# Patient Record
Sex: Female | Born: 1980 | Race: White | Hispanic: No | Marital: Married | Smoking: Never smoker
Health system: Southern US, Community
[De-identification: ages and names within clinical notes are randomized; demographics above are authoritative.]

## PROBLEM LIST (undated history)

## (undated) ENCOUNTER — Inpatient Hospital Stay (HOSPITAL_COMMUNITY): Payer: Self-pay

## (undated) DIAGNOSIS — Z789 Other specified health status: Secondary | ICD-10-CM

## (undated) HISTORY — PX: NO PAST SURGERIES: SHX2092

---

## 2014-12-13 ENCOUNTER — Ambulatory Visit (HOSPITAL_COMMUNITY): Payer: Self-pay

## 2016-02-22 LAB — OB RESULTS CONSOLE RPR: RPR: NONREACTIVE

## 2016-02-22 LAB — OB RESULTS CONSOLE GC/CHLAMYDIA
CHLAMYDIA, DNA PROBE: NEGATIVE
GC PROBE AMP, GENITAL: NEGATIVE

## 2016-02-22 LAB — OB RESULTS CONSOLE ABO/RH: RH Type: POSITIVE

## 2016-02-22 LAB — OB RESULTS CONSOLE HEPATITIS B SURFACE ANTIGEN: Hepatitis B Surface Ag: NEGATIVE

## 2016-02-22 LAB — OB RESULTS CONSOLE HIV ANTIBODY (ROUTINE TESTING): HIV: NONREACTIVE

## 2016-02-22 LAB — OB RESULTS CONSOLE RUBELLA ANTIBODY, IGM: Rubella: IMMUNE

## 2016-02-22 LAB — OB RESULTS CONSOLE ANTIBODY SCREEN: ANTIBODY SCREEN: NEGATIVE

## 2016-08-02 ENCOUNTER — Encounter (HOSPITAL_COMMUNITY): Payer: Self-pay | Admitting: *Deleted

## 2016-08-02 ENCOUNTER — Inpatient Hospital Stay (HOSPITAL_COMMUNITY)
Admission: AD | Admit: 2016-08-02 | Discharge: 2016-08-02 | Disposition: A | Discharge status: Home / Self Care | Payer: Managed Care, Other (non HMO) | Source: Ambulatory Visit | Attending: Obstetrics & Gynecology | Admitting: Obstetrics & Gynecology

## 2016-08-02 DIAGNOSIS — O133 Gestational [pregnancy-induced] hypertension without significant proteinuria, third trimester: Secondary | ICD-10-CM | POA: Diagnosis not present

## 2016-08-02 DIAGNOSIS — Z3A34 34 weeks gestation of pregnancy: Secondary | ICD-10-CM | POA: Insufficient documentation

## 2016-08-02 DIAGNOSIS — O163 Unspecified maternal hypertension, third trimester: Secondary | ICD-10-CM | POA: Diagnosis present

## 2016-08-02 HISTORY — DX: Other specified health status: Z78.9

## 2016-08-02 LAB — URINALYSIS, ROUTINE W REFLEX MICROSCOPIC
BILIRUBIN URINE: NEGATIVE
Glucose, UA: NEGATIVE mg/dL
Hgb urine dipstick: NEGATIVE
Ketones, ur: NEGATIVE mg/dL
NITRITE: NEGATIVE
PH: 7 (ref 5.0–8.0)
Protein, ur: NEGATIVE mg/dL
SPECIFIC GRAVITY, URINE: 1.01 (ref 1.005–1.030)

## 2016-08-02 LAB — COMPREHENSIVE METABOLIC PANEL
ALK PHOS: 92 U/L (ref 38–126)
ALT: 12 U/L — AB (ref 14–54)
AST: 16 U/L (ref 15–41)
Albumin: 2.9 g/dL — ABNORMAL LOW (ref 3.5–5.0)
Anion gap: 10 (ref 5–15)
BUN: 5 mg/dL — ABNORMAL LOW (ref 6–20)
CALCIUM: 8.5 mg/dL — AB (ref 8.9–10.3)
CHLORIDE: 103 mmol/L (ref 101–111)
CO2: 18 mmol/L — ABNORMAL LOW (ref 22–32)
CREATININE: 0.56 mg/dL (ref 0.44–1.00)
Glucose, Bld: 77 mg/dL (ref 65–99)
Potassium: 3.5 mmol/L (ref 3.5–5.1)
Sodium: 131 mmol/L — ABNORMAL LOW (ref 135–145)
TOTAL PROTEIN: 6.9 g/dL (ref 6.5–8.1)
Total Bilirubin: 0.7 mg/dL (ref 0.3–1.2)

## 2016-08-02 LAB — URINE MICROSCOPIC-ADD ON
Bacteria, UA: NONE SEEN
RBC / HPF: NONE SEEN RBC/hpf (ref 0–5)

## 2016-08-02 LAB — CBC
HCT: 33.5 % — ABNORMAL LOW (ref 36.0–46.0)
Hemoglobin: 11.1 g/dL — ABNORMAL LOW (ref 12.0–15.0)
MCH: 27.5 pg (ref 26.0–34.0)
MCHC: 33.1 g/dL (ref 30.0–36.0)
MCV: 82.9 fL (ref 78.0–100.0)
PLATELETS: 326 10*3/uL (ref 150–400)
RBC: 4.04 MIL/uL (ref 3.87–5.11)
RDW: 14.2 % (ref 11.5–15.5)
WBC: 12.6 10*3/uL — ABNORMAL HIGH (ref 4.0–10.5)

## 2016-08-02 LAB — PROTEIN / CREATININE RATIO, URINE: CREATININE, URINE: 47 mg/dL

## 2016-08-02 NOTE — Discharge Instructions (Signed)
Hypertension During Pregnancy °Hypertension is also called high blood pressure. Blood pressure moves blood in your body. Sometimes, the force that moves the blood becomes too strong. When you are pregnant, this condition should be watched carefully. It can cause problems for you and your baby. °HOME CARE  °· Make and keep all of your doctor visits. °· Take medicine as told by your doctor. Tell your doctor about all medicines you take. °· Eat very little salt. °· Exercise regularly. °· Do not drink alcohol. °· Do not smoke. °· Do not have drinks with caffeine. °· Lie on your left side when resting. °· Your health care provider may ask you to take one low-dose aspirin (81mg) each day. °GET HELP RIGHT AWAY IF: °· You have bad belly (abdominal) pain. °· You have sudden puffiness (swelling) in the hands, ankles, or face. °· You gain 4 pounds (1.8 kilograms) or more in 1 week. °· You throw up (vomit) repeatedly. °· You have bleeding from the vagina. °· You do not feel the baby moving as much. °· You have a headache. °· You have blurred or double vision. °· You have muscle twitching or spasms. °· You have shortness of breath. °· You have blue fingernails and lips. °· You have blood in your pee (urine). °MAKE SURE YOU: °· Understand these instructions. °· Will watch your condition. °· Will get help right away if you are not doing well or get worse. °  °This information is not intended to replace advice given to you by your health care provider. Make sure you discuss any questions you have with your health care provider. °  °Document Released: 01/18/2011 Document Revised: 01/06/2015 Document Reviewed: 07/15/2013 °Elsevier Interactive Patient Education ©2016 Else[employer] Inc. ° °

## 2016-08-02 NOTE — MAU Note (Signed)
Pt states she has been experiencing eye twitching for the last 2-3 days.  Sometimes both or just one.  Pt took her own BP at home and it was 154/98 but states her BP is usually in the 120's.  Denies HA/denies visual changes expect for the "twitiching", or epigastric pain.  Good fetal movement.  No vaginal bleeding or ROM.

## 2016-08-02 NOTE — MAU Provider Note (Signed)
Chief Complaint:  Hypertension   First Provider Initiated Contact with Patient 08/02/16 1512    HPI:     Joyce Greene is a 35 y.o. G1P0 at 15w6dwho presents to maternity admissions reporting elevated BP at home today.  Has never had hypertension before.  Also complains of some twitching of her left eye.  Some swelling in feet at the end of the day. She reports good fetal movement, denies LOF, vaginal bleeding, vaginal itching/burning, urinary symptoms, h/a, dizziness, n/v, diarrhea, constipation or fever/chills.  She denies headache, visual changes or RUQ abdominal pain.  Hypertension  This is a new problem. The current episode started today. Associated symptoms include peripheral edema. Pertinent negatives include no anxiety, blurred vision, chest pain, headaches or shortness of breath. There are no associated agents to hypertension. Risk factors: Pregnancy. Past treatments include nothing. There are no compliance problems.    RN Note: Pt states she has been experiencing eye twitching for the last 2-3 days.  Sometimes both or just one.  Pt took her own BP at home and it was 154/98 but states her BP is usually in the 120's.  Denies HA/denies visual changes expect for the "twitiching", or epigastric pain.  Good fetal movement.  No vaginal bleeding or ROM.  Past Medical History: No past medical history on file.  Past obstetric history: OB History  Gravida Para Term Preterm AB Living  1            SAB TAB Ectopic Multiple Live Births               # Outcome Date GA Lbr Len/2nd Weight Sex Delivery Anes PTL Lv  1 Current               Past Surgical History: No past surgical history on file.  Family History: No family history on file.  Social History: Social History  Substance Use Topics  . Smoking status: Not on file  . Smokeless tobacco: Not on file  . Alcohol use Not on file    Allergies: Allergies not on file  Meds:  No prescriptions prior to admission.    I have  reviewed patient's Past Medical Hx, Surgical Hx, Family Hx, Social Hx, medications and allergies.   ROS:  Review of Systems  Eyes: Negative for blurred vision and visual disturbance.  Respiratory: Negative for shortness of breath.   Cardiovascular: Negative for chest pain.  Gastrointestinal: Negative for constipation, diarrhea, nausea and vomiting.  Genitourinary: Negative for pelvic pain and vaginal bleeding.  Musculoskeletal: Negative for back pain.  Neurological: Negative for dizziness, seizures, syncope, weakness, numbness and headaches.   Other systems negative  Physical Exam  Patient Vitals for the past 24 hrs:  BP Temp Temp src Pulse Resp SpO2  08/02/16 1508 146/92 97.9 F (36.6 C) Oral 99 20 98 %   Vitals:   08/02/16 1544 08/02/16 1559 08/02/16 1622 08/02/16 1810  BP: 131/83 123/84 112/81 136/75  Pulse: 91 100 94 83  Resp:    18  Temp:    97.8 F (36.6 C)  TempSrc:    Oral  SpO2:        Constitutional: Well-developed, well-nourished female in no acute distress.  Cardiovascular: normal rate and rhythm Respiratory: normal effort, clear to auscultation bilaterally GI: Abd soft, non-tender, gravid appropriate for gestational age.   No rebound or guarding. MS: Extremities nontender, Trace to 1+ pedal edema, normal ROM Neurologic: Alert and oriented x 4.   DTRs 2+ with no clonus  GU: Neg CVAT.   FHT:  Baseline 130 , moderate variability, accelerations present, no decelerations Contractions: q 2-3 mins Irregular, mild   Labs: Results for orders placed or performed during the hospital encounter of 08/02/16 (from the past 24 hour(s))  Urinalysis, Routine w reflex microscopic (not at [facility])     Status: Abnormal   Collection Time: 08/02/16  3:00 PM  Result Value Ref Range   Color, Urine YELLOW YELLOW   APPearance CLEAR CLEAR   Specific Gravity, Urine 1.010 1.005 - 1.030   pH 7.0 5.0 - 8.0   Glucose, UA NEGATIVE NEGATIVE mg/dL   Hgb urine dipstick NEGATIVE NEGATIVE    Bilirubin Urine NEGATIVE NEGATIVE   Ketones, ur NEGATIVE NEGATIVE mg/dL   Protein, ur NEGATIVE NEGATIVE mg/dL   Nitrite NEGATIVE NEGATIVE   Leukocytes, UA SMALL (A) NEGATIVE  Protein / creatinine ratio, urine     Status: None   Collection Time: 08/02/16  3:00 PM  Result Value Ref Range   Creatinine, Urine 47.00 mg/dL   Total Protein, Urine <6 mg/dL   Protein Creatinine Ratio        0.00 - 0.15 mg/mg[Cre]  Urine microscopic-add on     Status: Abnormal   Collection Time: 08/02/16  3:00 PM  Result Value Ref Range   Squamous Epithelial / LPF 0-5 (A) NONE SEEN   WBC, UA 0-5 0 - 5 WBC/hpf   RBC / HPF NONE SEEN 0 - 5 RBC/hpf   Bacteria, UA NONE SEEN NONE SEEN  CBC     Status: Abnormal   Collection Time: 08/02/16  4:34 PM  Result Value Ref Range   WBC 12.6 (H) 4.0 - 10.5 K/uL   RBC 4.04 3.87 - 5.11 MIL/uL   Hemoglobin 11.1 (L) 12.0 - 15.0 g/dL   HCT 16.1 (L) 09.6 - 04.5 %   MCV 82.9 78.0 - 100.0 fL   MCH 27.5 26.0 - 34.0 pg   MCHC 33.1 30.0 - 36.0 g/dL   RDW 40.9 81.1 - 91.4 %   Platelets 326 150 - 400 K/uL  Comprehensive metabolic panel     Status: Abnormal   Collection Time: 08/02/16  4:34 PM  Result Value Ref Range   Sodium 131 (L) 135 - 145 mmol/L   Potassium 3.5 3.5 - 5.1 mmol/L   Chloride 103 101 - 111 mmol/L   CO2 18 (L) 22 - 32 mmol/L   Glucose, Bld 77 65 - 99 mg/dL   BUN 5 (L) 6 - 20 mg/dL   Creatinine, Ser 7.82 0.44 - 1.00 mg/dL   Calcium 8.5 (L) 8.9 - 10.3 mg/dL   Total Protein 6.9 6.5 - 8.1 g/dL   Albumin 2.9 (L) 3.5 - 5.0 g/dL   AST 16 15 - 41 U/L   ALT 12 (L) 14 - 54 U/L   Alkaline Phosphatase 92 38 - 126 U/L   Total Bilirubin 0.7 0.3 - 1.2 mg/dL   GFR calc non Af Amer >60 >60 mL/min   GFR calc Af Amer >60 >60 mL/min   Anion gap 10 5 - 15    Imaging:  No results found.  MAU Course/MDM: I have ordered labs and reviewed results.  NST reviewed Consult Dr Langston Masker with presentation, exam findings and test results.  Treatments in MAU included none.     Assessment: [facility] at [redacted]w[redacted]d Gestational Hypertension with no signs of preeclampsia Reassuring fetal heart rate tracing  Plan: Discharge home Preeclampsia signs and symptoms reviewed in detail Preterm Labor precautions and  fetal kick counts Follow up in Office for prenatal visits and recheck early next week    Medication List    You have not been prescribed any medications.    Pt stable at time of discharge.  Encouraged to return here or to other Urgent Care/ED if she develops worsening of symptoms, increase in pain, fever, or other concerning symptoms.      Wynelle Bourgeois CNM, MSN Certified Nurse-Midwife 08/02/2016 3:12 PM

## 2016-08-30 ENCOUNTER — Telehealth (HOSPITAL_COMMUNITY): Payer: Self-pay | Admitting: *Deleted

## 2016-08-30 ENCOUNTER — Encounter (HOSPITAL_COMMUNITY): Payer: Self-pay | Admitting: *Deleted

## 2016-08-30 LAB — OB RESULTS CONSOLE GBS: STREP GROUP B AG: NEGATIVE

## 2016-08-30 NOTE — Telephone Encounter (Signed)
Preadmission screen  

## 2016-09-05 ENCOUNTER — Encounter (HOSPITAL_COMMUNITY)
Admission: RE | Admit: 2016-09-05 | Discharge: 2016-09-05 | Disposition: A | Discharge status: Home / Self Care | Payer: Managed Care, Other (non HMO) | Source: Ambulatory Visit | Attending: Obstetrics and Gynecology | Admitting: Obstetrics and Gynecology

## 2016-09-05 LAB — CBC
HEMATOCRIT: 32.7 % — AB (ref 36.0–46.0)
HEMOGLOBIN: 10.8 g/dL — AB (ref 12.0–15.0)
MCH: 27.2 pg (ref 26.0–34.0)
MCHC: 33 g/dL (ref 30.0–36.0)
MCV: 82.4 fL (ref 78.0–100.0)
Platelets: 350 10*3/uL (ref 150–400)
RBC: 3.97 MIL/uL (ref 3.87–5.11)
RDW: 14.8 % (ref 11.5–15.5)
WBC: 15.2 10*3/uL — ABNORMAL HIGH (ref 4.0–10.5)

## 2016-09-05 LAB — ABO/RH: ABO/RH(D): A POS

## 2016-09-05 LAB — TYPE AND SCREEN
ABO/RH(D): A POS
Antibody Screen: NEGATIVE

## 2016-09-05 NOTE — H&P (Signed)
[place] Joyce Greene is a 35 y.o. female presenting for primary cesarean section. U/S in office 08/29/16 had EFW 4273 gm (9# 7oz). Glucola normal. Also bilateral fetal pyelectasis noted through pregnancy. OB History    Gravida Para Term Preterm AB Living   3       2     SAB TAB Ectopic Multiple Live Births   2       0     Past Medical History:  Diagnosis Date  . Medical history non-contributory    Past Surgical History:  Procedure Laterality Date  . NO PAST SURGERIES     Family History: family history includes Cancer in her maternal grandmother and paternal grandmother; Hypertension in her father; Stroke in her paternal grandfather. Social History:  reports that she has never smoked. She has never used smokeless tobacco. She reports that she does not drink alcohol or use drugs.     Maternal Diabetes: No Genetic Screening: Normal Maternal Ultrasounds/Referrals: Normal Fetal Ultrasounds or other Referrals:  None Maternal Substance Abuse:  No Significant Maternal Medications:  None Significant Maternal Lab Results:  None Other Comments:  None  Review of Systems  Eyes: Negative for blurred vision.  Gastrointestinal: Negative for abdominal pain.  Neurological: Negative for headaches.   Maternal Medical History:  Fetal activity: Perceived fetal activity is normal.        There were no vitals taken for this visit. Maternal Exam:  Abdomen: Patient reports no abdominal tenderness. Fetal presentation: vertex     Physical Exam  Cardiovascular: Normal rate and regular rhythm.   Respiratory: Effort normal and breath sounds normal.  GI: Soft. There is no tenderness.  Neurological: She has normal reflexes.    Prenatal labs: ABO, Rh: A/Positive/-- (02/23 0000) Antibody: Negative (02/23 0000) Rubella: Immune (02/23 0000) RPR: Nonreactive (02/23 0000)  HBsAg: Negative (02/23 0000)  HIV: Non-reactive (02/23 0000)  GBS: Negative (09/01 0000)   Assessment/Plan: 35 yo G3P0 with  fetal macrosomia Options reviewed with patient and she elects primary cesarean section Risks reviewed including infection, organ damage, bleeding/transfusion-HIV/Hep, DVT/PE, pneumonia.  All questions answered Patient states she understands and agrees   Vesta Wheeland II,Dakai Braithwaite E 09/05/2016, 9:09 AM

## 2016-09-06 ENCOUNTER — Inpatient Hospital Stay (HOSPITAL_COMMUNITY): Payer: Managed Care, Other (non HMO) | Admitting: Certified Registered Nurse Anesthetist

## 2016-09-06 ENCOUNTER — Encounter (HOSPITAL_COMMUNITY): Payer: Self-pay

## 2016-09-06 ENCOUNTER — Encounter (HOSPITAL_COMMUNITY)
Admission: RE | Disposition: A | Discharge status: Home / Self Care | Payer: Self-pay | Source: Ambulatory Visit | Attending: Obstetrics and Gynecology

## 2016-09-06 ENCOUNTER — Inpatient Hospital Stay (HOSPITAL_COMMUNITY)
Admission: RE | Admit: 2016-09-06 | Discharge: 2016-09-09 | DRG: 766 | Disposition: A | Discharge status: Home / Self Care | Payer: Managed Care, Other (non HMO) | Source: Ambulatory Visit | Attending: Obstetrics and Gynecology | Admitting: Obstetrics and Gynecology

## 2016-09-06 ENCOUNTER — Inpatient Hospital Stay (HOSPITAL_COMMUNITY): Admission: RE | Admit: 2016-09-06 | Payer: Managed Care, Other (non HMO) | Source: Ambulatory Visit

## 2016-09-06 DIAGNOSIS — O99214 Obesity complicating childbirth: Secondary | ICD-10-CM | POA: Diagnosis present

## 2016-09-06 DIAGNOSIS — O3660X0 Maternal care for excessive fetal growth, unspecified trimester, not applicable or unspecified: Secondary | ICD-10-CM | POA: Diagnosis present

## 2016-09-06 DIAGNOSIS — Z8249 Family history of ischemic heart disease and other diseases of the circulatory system: Secondary | ICD-10-CM

## 2016-09-06 DIAGNOSIS — O358XX0 Maternal care for other (suspected) fetal abnormality and damage, not applicable or unspecified: Secondary | ICD-10-CM | POA: Diagnosis present

## 2016-09-06 DIAGNOSIS — K219 Gastro-esophageal reflux disease without esophagitis: Secondary | ICD-10-CM | POA: Diagnosis present

## 2016-09-06 DIAGNOSIS — O9962 Diseases of the digestive system complicating childbirth: Secondary | ICD-10-CM | POA: Diagnosis present

## 2016-09-06 DIAGNOSIS — Z3A39 39 weeks gestation of pregnancy: Secondary | ICD-10-CM | POA: Diagnosis not present

## 2016-09-06 DIAGNOSIS — O3663X0 Maternal care for excessive fetal growth, third trimester, not applicable or unspecified: Secondary | ICD-10-CM | POA: Diagnosis present

## 2016-09-06 DIAGNOSIS — E669 Obesity, unspecified: Secondary | ICD-10-CM | POA: Diagnosis present

## 2016-09-06 DIAGNOSIS — Z823 Family history of stroke: Secondary | ICD-10-CM | POA: Diagnosis not present

## 2016-09-06 DIAGNOSIS — IMO0002 Reserved for concepts with insufficient information to code with codable children: Secondary | ICD-10-CM | POA: Diagnosis present

## 2016-09-06 DIAGNOSIS — Z6838 Body mass index (BMI) 38.0-38.9, adult: Secondary | ICD-10-CM

## 2016-09-06 LAB — RPR: RPR: NONREACTIVE

## 2016-09-06 SURGERY — Surgical Case
Anesthesia: Spinal

## 2016-09-06 MED ORDER — BUPIVACAINE IN DEXTROSE 0.75-8.25 % IT SOLN
INTRATHECAL | Status: DC | PRN
  Administered 2016-09-06: 1.7 mL via INTRATHECAL

## 2016-09-06 MED ORDER — FENTANYL CITRATE (PF) 100 MCG/2ML IJ SOLN: 25.0000 ug | INTRAMUSCULAR | Status: DC | PRN

## 2016-09-06 MED ORDER — SIMETHICONE 80 MG PO CHEW
80.0000 mg | CHEWABLE_TABLET | Freq: Three times a day (TID) | ORAL | Status: DC
  Administered 2016-09-07 – 2016-09-09 (×7): 80 mg via ORAL
  Filled 2016-09-06 (×7): qty 1

## 2016-09-06 MED ORDER — MORPHINE SULFATE (PF) 0.5 MG/ML IJ SOLN
INTRAMUSCULAR | Status: DC | PRN
  Administered 2016-09-06: .2 mg via INTRATHECAL

## 2016-09-06 MED ORDER — OXYTOCIN 40 UNITS IN LACTATED RINGERS INFUSION - SIMPLE MED: 2.5000 [IU]/h | INTRAVENOUS | Status: AC

## 2016-09-06 MED ORDER — LACTATED RINGERS IV SOLN
INTRAVENOUS | Status: DC
  Administered 2016-09-06 (×3): via INTRAVENOUS

## 2016-09-06 MED ORDER — DIPHENHYDRAMINE HCL 25 MG PO CAPS: 25.0000 mg | ORAL_CAPSULE | ORAL | Status: DC | PRN

## 2016-09-06 MED ORDER — PHENYLEPHRINE 8 MG IN D5W 100 ML (0.08MG/ML) PREMIX OPTIME
INJECTION | INTRAVENOUS | Status: AC
  Filled 2016-09-06: qty 100

## 2016-09-06 MED ORDER — KETOROLAC TROMETHAMINE 30 MG/ML IJ SOLN
INTRAMUSCULAR | Status: AC
  Filled 2016-09-06: qty 1

## 2016-09-06 MED ORDER — FENTANYL CITRATE (PF) 100 MCG/2ML IJ SOLN
INTRAMUSCULAR | Status: DC | PRN
  Administered 2016-09-06: 20 ug via INTRATHECAL

## 2016-09-06 MED ORDER — MEPERIDINE HCL 25 MG/ML IJ SOLN
6.2500 mg | INTRAMUSCULAR | Status: DC | PRN
  Administered 2016-09-06: 6.25 mg via INTRAVENOUS

## 2016-09-06 MED ORDER — TETANUS-DIPHTH-ACELL PERTUSSIS 5-2.5-18.5 LF-MCG/0.5 IM SUSP: 0.5000 mL | Freq: Once | INTRAMUSCULAR | Status: DC

## 2016-09-06 MED ORDER — NALOXONE HCL 2 MG/2ML IJ SOSY
1.0000 ug/kg/h | PREFILLED_SYRINGE | INTRAVENOUS | Status: DC | PRN
  Filled 2016-09-06: qty 2

## 2016-09-06 MED ORDER — ZOLPIDEM TARTRATE 5 MG PO TABS: 5.0000 mg | ORAL_TABLET | Freq: Every evening | ORAL | Status: DC | PRN

## 2016-09-06 MED ORDER — COCONUT OIL OIL
1.0000 "application " | TOPICAL_OIL | Status: DC | PRN
  Filled 2016-09-06: qty 120

## 2016-09-06 MED ORDER — ONDANSETRON HCL 4 MG/2ML IJ SOLN
INTRAMUSCULAR | Status: AC
  Filled 2016-09-06: qty 2

## 2016-09-06 MED ORDER — IBUPROFEN 600 MG PO TABS
600.0000 mg | ORAL_TABLET | Freq: Four times a day (QID) | ORAL | Status: DC
  Administered 2016-09-06 – 2016-09-09 (×11): 600 mg via ORAL
  Filled 2016-09-06 (×12): qty 1

## 2016-09-06 MED ORDER — SODIUM CHLORIDE 0.9% FLUSH: 3.0000 mL | INTRAVENOUS | Status: DC | PRN

## 2016-09-06 MED ORDER — ACETAMINOPHEN 325 MG PO TABS: 650.0000 mg | ORAL_TABLET | ORAL | Status: DC | PRN

## 2016-09-06 MED ORDER — SENNOSIDES-DOCUSATE SODIUM 8.6-50 MG PO TABS
2.0000 | ORAL_TABLET | ORAL | Status: DC
  Administered 2016-09-06 – 2016-09-09 (×3): 2 via ORAL
  Filled 2016-09-06 (×3): qty 2

## 2016-09-06 MED ORDER — PHENYLEPHRINE 40 MCG/ML (10ML) SYRINGE FOR IV PUSH (FOR BLOOD PRESSURE SUPPORT)
PREFILLED_SYRINGE | INTRAVENOUS | Status: AC
  Filled 2016-09-06: qty 10

## 2016-09-06 MED ORDER — NALBUPHINE HCL 10 MG/ML IJ SOLN
5.0000 mg | INTRAMUSCULAR | Status: DC | PRN
  Administered 2016-09-06 (×2): 5 mg via INTRAVENOUS
  Filled 2016-09-06: qty 1

## 2016-09-06 MED ORDER — SODIUM CHLORIDE 0.9 % IR SOLN
Status: DC | PRN
  Administered 2016-09-06: 1

## 2016-09-06 MED ORDER — CEFAZOLIN SODIUM-DEXTROSE 2-4 GM/100ML-% IV SOLN
2.0000 g | INTRAVENOUS | Status: AC
  Administered 2016-09-06: 2 g via INTRAVENOUS

## 2016-09-06 MED ORDER — SCOPOLAMINE 1 MG/3DAYS TD PT72
1.0000 | MEDICATED_PATCH | Freq: Once | TRANSDERMAL | Status: DC
  Administered 2016-09-06: 1.5 mg via TRANSDERMAL

## 2016-09-06 MED ORDER — PHENYLEPHRINE HCL 10 MG/ML IJ SOLN
INTRAMUSCULAR | Status: DC | PRN
  Administered 2016-09-06 (×2): 40 ug via INTRAVENOUS

## 2016-09-06 MED ORDER — IBUPROFEN 600 MG PO TABS: 600.0000 mg | ORAL_TABLET | Freq: Four times a day (QID) | ORAL | Status: DC | PRN

## 2016-09-06 MED ORDER — OXYTOCIN 40 UNITS IN LACTATED RINGERS INFUSION - SIMPLE MED
INTRAVENOUS | Status: DC | PRN
  Administered 2016-09-06: 40 [IU] via INTRAVENOUS

## 2016-09-06 MED ORDER — ONDANSETRON HCL 4 MG/2ML IJ SOLN: 4.0000 mg | Freq: Three times a day (TID) | INTRAMUSCULAR | Status: DC | PRN

## 2016-09-06 MED ORDER — LACTATED RINGERS IV SOLN: INTRAVENOUS | Status: DC

## 2016-09-06 MED ORDER — KETOROLAC TROMETHAMINE 30 MG/ML IJ SOLN: 30.0000 mg | Freq: Four times a day (QID) | INTRAMUSCULAR | Status: AC | PRN

## 2016-09-06 MED ORDER — WITCH HAZEL-GLYCERIN EX PADS: 1.0000 "application " | MEDICATED_PAD | CUTANEOUS | Status: DC | PRN

## 2016-09-06 MED ORDER — SIMETHICONE 80 MG PO CHEW: 80.0000 mg | CHEWABLE_TABLET | ORAL | Status: DC | PRN

## 2016-09-06 MED ORDER — NALBUPHINE HCL 10 MG/ML IJ SOLN: 5.0000 mg | Freq: Once | INTRAMUSCULAR | Status: DC | PRN

## 2016-09-06 MED ORDER — MORPHINE SULFATE-NACL 0.5-0.9 MG/ML-% IV SOSY
PREFILLED_SYRINGE | INTRAVENOUS | Status: AC
  Filled 2016-09-06: qty 1

## 2016-09-06 MED ORDER — PRENATAL MULTIVITAMIN CH
1.0000 | ORAL_TABLET | Freq: Every day | ORAL | Status: DC
  Administered 2016-09-07 – 2016-09-09 (×3): 1 via ORAL
  Filled 2016-09-06 (×3): qty 1

## 2016-09-06 MED ORDER — ACETAMINOPHEN 500 MG PO TABS
1000.0000 mg | ORAL_TABLET | Freq: Four times a day (QID) | ORAL | Status: AC
  Administered 2016-09-06 – 2016-09-07 (×4): 1000 mg via ORAL
  Filled 2016-09-06 (×4): qty 2

## 2016-09-06 MED ORDER — OXYCODONE HCL 5 MG PO TABS: 5.0000 mg | ORAL_TABLET | ORAL | Status: DC | PRN

## 2016-09-06 MED ORDER — DIPHENHYDRAMINE HCL 25 MG PO CAPS: 25.0000 mg | ORAL_CAPSULE | Freq: Four times a day (QID) | ORAL | Status: DC | PRN

## 2016-09-06 MED ORDER — OXYTOCIN 10 UNIT/ML IJ SOLN
INTRAMUSCULAR | Status: AC
  Filled 2016-09-06: qty 4

## 2016-09-06 MED ORDER — DIPHENHYDRAMINE HCL 50 MG/ML IJ SOLN
12.5000 mg | INTRAMUSCULAR | Status: DC | PRN
Start: 2016-09-06 — End: 2016-09-09

## 2016-09-06 MED ORDER — SIMETHICONE 80 MG PO CHEW
80.0000 mg | CHEWABLE_TABLET | ORAL | Status: DC
  Administered 2016-09-06 – 2016-09-09 (×3): 80 mg via ORAL
  Filled 2016-09-06 (×3): qty 1

## 2016-09-06 MED ORDER — SCOPOLAMINE 1 MG/3DAYS TD PT72
MEDICATED_PATCH | TRANSDERMAL | Status: AC
  Administered 2016-09-06: 1.5 mg via TRANSDERMAL
  Filled 2016-09-06: qty 1

## 2016-09-06 MED ORDER — DIBUCAINE 1 % RE OINT: 1.0000 "application " | TOPICAL_OINTMENT | RECTAL | Status: DC | PRN

## 2016-09-06 MED ORDER — LACTATED RINGERS IV SOLN
INTRAVENOUS | Status: DC | PRN
  Administered 2016-09-06: 12:00:00 via INTRAVENOUS

## 2016-09-06 MED ORDER — OXYCODONE HCL 5 MG PO TABS: 10.0000 mg | ORAL_TABLET | ORAL | Status: DC | PRN

## 2016-09-06 MED ORDER — NALBUPHINE HCL 10 MG/ML IJ SOLN: 5.0000 mg | INTRAMUSCULAR | Status: DC | PRN

## 2016-09-06 MED ORDER — ONDANSETRON HCL 4 MG/2ML IJ SOLN
INTRAMUSCULAR | Status: DC | PRN
  Administered 2016-09-06: 4 mg via INTRAVENOUS

## 2016-09-06 MED ORDER — KETOROLAC TROMETHAMINE 30 MG/ML IJ SOLN
30.0000 mg | Freq: Four times a day (QID) | INTRAMUSCULAR | Status: AC | PRN
  Administered 2016-09-06: 30 mg via INTRAMUSCULAR

## 2016-09-06 MED ORDER — PHENYLEPHRINE 8 MG IN D5W 100 ML (0.08MG/ML) PREMIX OPTIME
INJECTION | INTRAVENOUS | Status: DC | PRN
  Administered 2016-09-06: 30 ug/min via INTRAVENOUS

## 2016-09-06 MED ORDER — FENTANYL CITRATE (PF) 100 MCG/2ML IJ SOLN
INTRAMUSCULAR | Status: AC
  Filled 2016-09-06: qty 2

## 2016-09-06 MED ORDER — NALOXONE HCL 0.4 MG/ML IJ SOLN: 0.4000 mg | INTRAMUSCULAR | Status: DC | PRN

## 2016-09-06 MED ORDER — MEPERIDINE HCL 25 MG/ML IJ SOLN
INTRAMUSCULAR | Status: AC
  Filled 2016-09-06: qty 1

## 2016-09-06 MED ORDER — NALBUPHINE HCL 10 MG/ML IJ SOLN
5.0000 mg | Freq: Once | INTRAMUSCULAR | Status: DC | PRN
  Filled 2016-09-06: qty 1

## 2016-09-06 MED ORDER — MENTHOL 3 MG MT LOZG: 1.0000 | LOZENGE | OROMUCOSAL | Status: DC | PRN

## 2016-09-06 SURGICAL SUPPLY — 38 items
BENZOIN TINCTURE PRP APPL 2/3 (GAUZE/BANDAGES/DRESSINGS) ×3 IMPLANT
CHLORAPREP W/TINT 26ML (MISCELLANEOUS) ×3 IMPLANT
CLAMP CORD UMBIL (MISCELLANEOUS) IMPLANT
CLOSURE STERI-STRIP 1/2X4 (GAUZE/BANDAGES/DRESSINGS) ×1
CLOSURE WOUND 1/2 X4 (GAUZE/BANDAGES/DRESSINGS)
CLOTH BEACON ORANGE TIMEOUT ST (SAFETY) ×3 IMPLANT
CLSR STERI-STRIP ANTIMIC 1/2X4 (GAUZE/BANDAGES/DRESSINGS) ×2 IMPLANT
CONTAINER PREFILL 10% NBF 15ML (MISCELLANEOUS) IMPLANT
DRSG OPSITE POSTOP 4X10 (GAUZE/BANDAGES/DRESSINGS) ×3 IMPLANT
ELECT REM PT RETURN 9FT ADLT (ELECTROSURGICAL) ×3
ELECTRODE REM PT RTRN 9FT ADLT (ELECTROSURGICAL) ×1 IMPLANT
EXTRACTOR VACUUM M CUP 4 TUBE (SUCTIONS) IMPLANT
EXTRACTOR VACUUM M CUP 4' TUBE (SUCTIONS)
GAUZE SPONGE 4X4 12PLY STRL (GAUZE/BANDAGES/DRESSINGS) ×3 IMPLANT
GLOVE BIO SURGEON STRL SZ8 (GLOVE) ×3 IMPLANT
GLOVE BIOGEL PI IND STRL 7.0 (GLOVE) ×1 IMPLANT
GLOVE BIOGEL PI INDICATOR 7.0 (GLOVE) ×2
GOWN STRL REUS W/TWL LRG LVL3 (GOWN DISPOSABLE) ×6 IMPLANT
KIT ABG SYR 3ML LUER SLIP (SYRINGE) ×3 IMPLANT
LIQUID BAND (GAUZE/BANDAGES/DRESSINGS) ×3 IMPLANT
NEEDLE HYPO 25X5/8 SAFETYGLIDE (NEEDLE) ×3 IMPLANT
NS IRRIG 1000ML POUR BTL (IV SOLUTION) ×3 IMPLANT
PACK C SECTION WH (CUSTOM PROCEDURE TRAY) ×3 IMPLANT
PAD ABD 7.5X8 STRL (GAUZE/BANDAGES/DRESSINGS) ×3 IMPLANT
PAD ABD DERMACEA PRESS 5X9 (GAUZE/BANDAGES/DRESSINGS) ×3 IMPLANT
PAD OB MATERNITY 4.3X12.25 (PERSONAL CARE ITEMS) ×3 IMPLANT
PENCIL SMOKE EVAC W/HOLSTER (ELECTROSURGICAL) ×3 IMPLANT
STRIP CLOSURE SKIN 1/2X4 (GAUZE/BANDAGES/DRESSINGS) IMPLANT
SUT MNCRL 0 VIOLET CTX 36 (SUTURE) ×4 IMPLANT
SUT MONOCRYL 0 CTX 36 (SUTURE) ×8
SUT PDS AB 0 CTX 60 (SUTURE) ×3 IMPLANT
SUT PLAIN 0 NONE (SUTURE) IMPLANT
SUT PLAIN 2 0 (SUTURE)
SUT PLAIN 2 0 XLH (SUTURE) IMPLANT
SUT PLAIN ABS 2-0 CT1 27XMFL (SUTURE) IMPLANT
SUT VIC AB 4-0 KS 27 (SUTURE) ×3 IMPLANT
TOWEL OR 17X24 6PK STRL BLUE (TOWEL DISPOSABLE) ×3 IMPLANT
TRAY FOLEY CATH SILVER 14FR (SET/KITS/TRAYS/PACK) ×3 IMPLANT

## 2016-09-06 NOTE — Anesthesia Postprocedure Evaluation (Signed)
Anesthesia Post Note  Patient: [place]hell Levan[place]ia Vanacker  Procedure(s) Performed: Procedure(s) (LRB): CESAREAN SECTION (N/A)  Patient location during evaluation: PACU Anesthesia Type: Spinal Level of consciousness: oriented and awake and alert Pain management: pain level controlled Vital Signs Assessment: post-procedure vital signs reviewed and stable Respiratory status: spontaneous breathing, respiratory function stable and nonlabored ventilation Cardiovascular status: blood pressure returned to baseline and stable Postop Assessment: no headache, no backache, spinal receding, patient able to bend at knees and no signs of nausea or vomiting Anesthetic complications: no     Last Vitals:  Vitals:   09/06/16 1325 09/06/16 1335  BP:  132/77  Pulse: 88 82  Resp: (!) 25 18  Temp:  37.2 C    Last Pain:  Vitals:   09/06/16 1335  TempSrc: Oral  PainSc:    Pain Goal: Patients Stated Pain Goal: 3 (09/06/16 1013)               Ahman Dugdale A.

## 2016-09-06 NOTE — Anesthesia Postprocedure Evaluation (Signed)
Anesthesia Post Note  Patient: Marshell LevanVirginia Defalco  Procedure(s) Performed: Procedure(s) (LRB): CESAREAN SECTION (N/A)  Patient location during evaluation: Mother Baby Anesthesia Type: Spinal Level of consciousness: awake and alert Pain management: pain level controlled Vital Signs Assessment: post-procedure vital signs reviewed and stable Respiratory status: spontaneous breathing and nonlabored ventilation Cardiovascular status: stable Postop Assessment: no headache, patient able to bend at knees, no signs of nausea or vomiting, no backache, spinal receding and adequate PO intake Anesthetic complications: no     Last Vitals:  Vitals:   09/06/16 1335 09/06/16 1430  BP: 132/77 130/74  Pulse: 82 80  Resp: 18 18  Temp: 37.2 C 36.8 C    Last Pain:  Vitals:   09/06/16 1430  TempSrc: Oral  PainSc: 3    Pain Goal: Patients Stated Pain Goal: 3 (09/06/16 1330)               Kaz Auld Hristova

## 2016-09-06 NOTE — Progress Notes (Signed)
Pt assisted up to bathroom to do peri care, peri care taught. Pt tolerated ambulation well. Pt sitting on edge of bed at present.

## 2016-09-06 NOTE — Anesthesia Preprocedure Evaluation (Addendum)
Anesthesia Evaluation  Patient identified by MRN, date of birth, ID band Patient awake    Reviewed: Allergy & Precautions, NPO status , Patient's Chart, lab work & pertinent test results  Airway Mallampati: II       Dental no notable dental hx. (+) Teeth Intact   Pulmonary neg pulmonary ROS,    Pulmonary exam normal breath sounds clear to auscultation       Cardiovascular negative cardio ROS Normal cardiovascular exam Rhythm:Regular Rate:Normal     Neuro/Psych negative neurological ROS  negative psych ROS   GI/Hepatic Neg liver ROS, GERD  Medicated and Controlled,  Endo/Other  Obesity  Renal/GU negative Renal ROS  negative genitourinary   Musculoskeletal negative musculoskeletal ROS (+)   Abdominal (+) - obese,   Peds  Hematology  (+) anemia ,   Anesthesia Other Findings   Reproductive/Obstetrics (+) Pregnancy Fetal macrosomia                            Lab Results  Component Value Date   WBC 15.2 (H) 09/05/2016   HGB 10.8 (L) 09/05/2016   HCT 32.7 (L) 09/05/2016   MCV 82.4 09/05/2016   PLT 350 09/05/2016    Anesthesia Physical Anesthesia Plan  ASA: II  Anesthesia Plan: Spinal   Post-op Pain Management:    Induction:   Airway Management Planned: Natural Airway  Additional Equipment:   Intra-op Plan:   Post-operative Plan:   Informed Consent: I have reviewed the patients History and Physical, chart, labs and discussed the procedure including the risks, benefits and alternatives for the proposed anesthesia with the patient or authorized representative who has indicated his/her understanding and acceptance.     Plan Discussed with: Anesthesiologist, CRNA and Surgeon  Anesthesia Plan Comments:         Anesthesia Quick Evaluation

## 2016-09-06 NOTE — Progress Notes (Signed)
No changes to H&P per patient history Reviewed procedure-cesarean section All questions answered Patient states she understands and agrees 

## 2016-09-06 NOTE — Brief Op Note (Signed)
09/06/2016  11:55 AM  PATIENT:  [place]ce [place] Greene  35 y.o. female  PRE-OPERATIVE DIAGNOSIS:  large EFW  POST-OPERATIVE DIAGNOSIS:  CESAREAN SECTION large EFW  PROCEDURE:  Procedure(s) with comments: CESAREAN SECTION (N/A) - Primary edc 09/07/16 nkda   SURGEON:  Surgeon(s) and Role:    * Harold HedgeJames Janet Decesare, MD - Primary  PHYSICIAN ASSISTANT:   ASSISTANTS: none   ANESTHESIA:   spinal  EBL:  Total I/O In: -  Out: 750 [Urine:50; Blood:700]  BLOOD ADMINISTERED:none  DRAINS: Urinary Catheter (Foley)   LOCAL MEDICATIONS USED:  NONE  SPECIMEN:  No Specimen  DISPOSITION OF SPECIMEN:  N/A  COUNTS:  YES  TOURNIQUET:  * No tourniquets in log *  DICTATION: .Other Dictation: Dictation Number [id number]  PLAN OF CARE: Admit to inpatient   PATIENT DISPOSITION:  PACU - hemodynamically stable.   Delay start of Pharmacological VTE agent (>24hrs) due to surgical blood loss or risk of bleeding: not applicable

## 2016-09-06 NOTE — Transfer of Care (Signed)
Immediate Anesthesia Transfer of Care Note  Patient: Joyce LevanVirginia Tristan  Procedure(s) Performed: Procedure(s) with comments: CESAREAN SECTION (N/A) - Primary edc 09/07/16 nkda   Patient Location: PACU  Anesthesia Type:Spinal  Level of Consciousness: awake and alert   Airway & Oxygen Therapy: Patient Spontanous Breathing  Post-op Assessment: Report given to RN and Post -op Vital signs reviewed and stable  Post vital signs: Reviewed  Last Vitals:  Vitals:   09/06/16 1013  BP: 126/89  Pulse: (!) 109  Resp: 20  Temp: 36.8 C    Last Pain:  Vitals:   09/06/16 1013  TempSrc: Oral      Patients Stated Pain Goal: 3 (09/06/16 1013)  Complications: No apparent anesthesia complications

## 2016-09-06 NOTE — Progress Notes (Signed)
IV noted to feel wet at site.  Inspected for leaks at hub, nothing found, but felt wet. IV appeared to be falling out. Dc'd this site, catheter intact. Restarted IV in Left hand.

## 2016-09-06 NOTE — Anesthesia Procedure Notes (Signed)
Spinal  Patient location during procedure: OR Start time: 09/06/2016 11:19 AM Staffing Anesthesiologist: Mal AmabileFOSTER, Emilea Goga Performed: anesthesiologist  Preanesthetic Checklist Completed: patient identified, site marked, surgical consent, pre-op evaluation, timeout performed, IV checked, risks and benefits discussed and monitors and equipment checked Spinal Block Patient position: sitting Prep: site prepped and draped and DuraPrep Patient monitoring: heart rate, cardiac monitor, continuous pulse ox and blood pressure Approach: midline Location: L3-4 Injection technique: single-shot Needle Needle type: Sprotte  Needle gauge: 24 G Needle length: 9 cm Needle insertion depth: 5.5 cm Assessment Sensory level: T4 Additional Notes Patient tolerated procedure well. Adequate sensory level.

## 2016-09-06 NOTE — Addendum Note (Signed)
Addendum  created 09/06/16 1909 by Elgie CongoNataliya H Reis Goga, CRNA   Sign clinical note

## 2016-09-06 NOTE — Lactation Note (Signed)
This note was copied from a baby's chart. Lactation Consultation Note  P1, Baby 3 hours old.  Reviewed hand expression w/ mother.  Glistening expressed. Assisted w/ latching in cross cradle hold.  A few sucks and swallows observed and baby fell asleep. Discussed basics,  Mother seems concerned about glistening.  Reassured her. Placed baby STS on FOB chest.  Suggest watching for cues and then try again later. Mom encouraged to feed baby 8-12 times/24 hours and with feeding cues.  Mom made aware of O/P services, breastfeeding support groups, community resources, and our phone # for post-discharge questions.    Patient Name: Joyce Greene Today's Date: 09/06/2016 Reason for consult: Initial assessment   Maternal Data Has patient been taught Hand Expression?: Yes Does the patient have breastfeeding experience prior to this delivery?: No  Feeding Feeding Type: Breast Fed Length of feed: 5 min  LATCH Score/Interventions Latch: Grasps breast easily, tongue down, lips flanged, rhythmical sucking.  Audible Swallowing: A few with stimulation  Type of Nipple: Everted at rest and after stimulation  Comfort (Breast/Nipple): Soft / non-tender     Hold (Positioning): Assistance needed to correctly position infant at breast and maintain latch. Intervention(s): Position options  LATCH Score: 8  Lactation Tools Discussed/Used     Consult Status Consult Status: Follow-up Date: 09/07/16 Follow-up type: In-patient    Dahlia ByesBerkelhammer, [facility] 09/06/2016, 3:27 PM

## 2016-09-07 LAB — CBC
HEMATOCRIT: 25.7 % — AB (ref 36.0–46.0)
HEMOGLOBIN: 8.5 g/dL — AB (ref 12.0–15.0)
MCH: 27.1 pg (ref 26.0–34.0)
MCHC: 33.1 g/dL (ref 30.0–36.0)
MCV: 81.8 fL (ref 78.0–100.0)
PLATELETS: 248 10*3/uL (ref 150–400)
RBC: 3.14 MIL/uL — AB (ref 3.87–5.11)
RDW: 15.1 % (ref 11.5–15.5)
WBC: 11.7 10*3/uL — AB (ref 4.0–10.5)

## 2016-09-07 LAB — BIRTH TISSUE RECOVERY COLLECTION (PLACENTA DONATION)

## 2016-09-07 MED ORDER — FERROUS SULFATE 325 (65 FE) MG PO TABS
325.0000 mg | ORAL_TABLET | Freq: Two times a day (BID) | ORAL | Status: DC
  Administered 2016-09-07 – 2016-09-09 (×4): 325 mg via ORAL
  Filled 2016-09-07 (×4): qty 1

## 2016-09-07 NOTE — Progress Notes (Signed)
Subjective: Postpartum Day 1: Cesarean Delivery Patient reports tolerating PO, + flatus and no problems voiding.    Objective: Vital signs in last 24 hours: Temp:  [97.7 F (36.5 C)-98.9 F (37.2 C)] 97.9 F (36.6 C) (09/09 0740) Pulse Rate:  [64-109] 64 (09/09 0740) Resp:  [14-26] 18 (09/09 0740) BP: (108-138)/(62-91) 121/72 (09/09 0740) SpO2:  [92 %-100 %] 92 % (09/09 0740) Weight:  [254 lb (115.2 kg)] 254 lb (115.2 kg) (09/08 1036)  Physical Exam:  General: alert, cooperative and no distress Lochia: appropriate Uterine Fundus: firm Incision: healing well DVT Evaluation: No evidence of DVT seen on physical exam.   Recent Labs  09/05/16 1010 09/07/16 0524  HGB 10.8* 8.5*  HCT 32.7* 25.7*    Assessment/Plan: Status post Cesarean section. Doing well postoperatively.  Continue current care Ferrous sulfate.  Sharada Albornoz II,Khayden Herzberg E 09/07/2016, 9:47 AM

## 2016-09-07 NOTE — Op Note (Signed)
NAME:  Joyce Greene, Dametra          ACCOUNT NO.:  [id number]  MEDICAL RECORD NO.:  [id number]  LOCATION:  9127                          FACILITY:  [facility]  PHYSICIAN:  Guy SandiferJames E. Henderson Cloudomblin, M.D. DATE OF BIRTH:  Dec 19, 1981  DATE OF PROCEDURE:  09/06/2016 DATE OF DISCHARGE:                              OPERATIVE REPORT   PREOPERATIVE DIAGNOSIS:  Fetal macrosomia.  POSTOPERATIVE DIAGNOSIS:  Fetal macrosomia.  PROCEDURE:  Primary cesarean section, low transverse.  SURGEON:  Harold HedgeJames Karthikeya Funke, MD.  ANESTHESIA:  Spinal.  ANESTHESIOLOGIST:  Angelica PouMichael A Foster, MD.  ESTIMATED BLOOD LOSS:  600 mL.  FINDINGS:  Viable female infant.  Apgars, arterial cord pH, and weight pending at the time of delivery.  SPECIMENS:  None.  INDICATIONS AND CONSENT:  This patient is a 35 year old patient at 3039- 6/7th weeks.  Estimated fetal weight on ultrasound 1 week ago was 9 pounds 7 ounces.  Glucola is normal.  After discussion of options, the patient elected for primary cesarean section.  Potential risks and complications were reviewed preoperatively including, but not limited to, infection, organ damage, bleeding, requiring transfusion of blood products with HIV and hepatitis acquisition, DVT, PE, and pneumonia. All questions have been answered, and consent is signed on the chart.  DESCRIPTION OF PROCEDURE:  The patient was taken to the operating room, where she was identified.  Spinal anesthetic was placed, and she was placed in the dorsal supine position with a 15-degree left lateral wedge.  She was then prepped.  Foley [facility] and [facility].  Time-out was undertaken.  After testing for adequate spinal anesthesia, skin was entered through a Pfannenstiel incision, and dissection was carried out in layers to the peritoneum. Peritoneum was entered and extended superiorly and inferiorly. Vesicouterine peritoneum was taken down cephalolaterally.  Bladder flap was developed,  and the bladder blade was placed.  Uterus was incised in a low transverse manner.  The uterine cavity was entered bluntly with a hemostat.  Clear fluid was noted.  The uterine incision was extended cephalolaterally with the fingers.  Loose nuchal cord x1 was noted and reduced.  Vertex was delivered with the aid of the vacuum extractor without difficulty.  Remainder of the baby was delivered.  Good cry and tone were noted.  Cord was clamped and cut.  The baby was handed to the awaiting pediatrics team.  Placenta was manually delivered.  Uterine cavity was clean.  Uterus was closed in 2 running locking imbricating layers of 0 Monocryl suture, which achieved good hemostasis.  Tubes and ovaries were normal.  Anterior peritoneum was closed in a running fashion with 0 Monocryl suture, which was also used to reapproximate the pyramidalis muscle in the midline.  Anterior rectus fascia was closed in a running layer with 0 looped PDS suture.  Subcutaneous layer was closed with interrupted plain, and the skin was closed in a subcuticular fashion with 4-0 Vicryl on a Keith needle.  Steri-Strips, dressings were applied.  All counts were correct.  The patient was taken to the recovery room in stable condition.     Guy SandiferJames E. Henderson Cloudomblin, M.D.     JET/MEDQ  D:  09/06/2016  T:  09/07/2016  Job:  [id number] 

## 2016-09-07 NOTE — Progress Notes (Signed)
Pt drinking plenty of fluids 1000 cc my shift. Foley output 1000 cc pale yellow. Foley removed with catheter intact Encouraged pt to drink fluids. Pt verbalized understanding..Marland Kitchen

## 2016-09-08 NOTE — Lactation Note (Signed)
This note was copied from a baby's chart. Lactation Consultation Note  Patient Name: Joyce Greene VHQIO'NToday's Date: 09/08/2016 Reason for consult: Follow-up assessment;Difficult latch Baby 36 hours old. Mom reports that she has sore nipples and baby seems sleepy at the breast. Baby fussy and cueing to nurse. Assisted mom to latch baby to right breast in football position. Baby latched deeply but chomping at the breast. Flanged baby's lower lip and mom reported increased comfort. Baby nursed with stimulation for 15 minutes and did have a few swallows. However, baby still cueing to nurse and neither mom nor this LC able to hand express more than a few drops of colostrum--primarily from the left breast. Attempted to elicit a suckle of this LC's gloved finger using EBM, but the baby gagged from the taste of the glove. So, could not evaluate the baby's suckle and ability to create a vacuum.   Mom's breast are somewhat wide-spaced and v-shaped. Mom agreed to start pumping, so set her up with DEBP. Mom teary and asking what did she do wrong. Discussed with mom that she has been putting the baby to breast but her milk is just not flowing enough for the baby at this time. Discussed with mom that she can pump to enc her milk flow to increase. Mom teary. Patient's bedside RN, Delight HohMarcella, in the room and enc mom to walk for a few minutes outside the room before beginning to pump. So, reviewed use and cleaning of pump, use of Alimentum and supplementation guidelines.  Plan is for mom to offer the breast with cues, then supplement with EBM/formula, and then postpump for 15 minutes followed by hand expression. Mom reports that she has a Spectra DEBP at home. Mom given comfort gels with review as well.    Maternal Data    Feeding Feeding Type: Breast Fed Length of feed: 15 min  LATCH Score/Interventions Latch: Repeated attempts needed to sustain latch, nipple held in mouth throughout feeding, stimulation  needed to elicit sucking reflex. Intervention(s): Adjust position;Assist with latch;Breast compression  Audible Swallowing: A few with stimulation Intervention(s): Skin to skin;Hand expression  Type of Nipple: Everted at rest and after stimulation  Comfort (Breast/Nipple): Soft / non-tender     Hold (Positioning): Assistance needed to correctly position infant at breast and maintain latch. Intervention(s): Support Pillows;Breastfeeding basics reviewed;Position options;Skin to skin  LATCH Score: 7  Lactation Tools Discussed/Used Pump Review: Setup, frequency, and cleaning;Milk Storage Initiated by:: JW Date initiated:: 09/08/16   Consult Status Consult Status: Follow-up Date: 09/09/16 Follow-up type: In-patient    Sherlyn HayJennifer D Dreana Britz 09/08/2016, 12:15 AM

## 2016-09-08 NOTE — Progress Notes (Signed)
Subjective: Postpartum Day 2: Cesarean Delivery Patient reports tolerating PO, + flatus, + BM and no problems voiding.    Objective: Vital signs in last 24 hours: Temp:  [97.4 F (36.3 C)-99 F (37.2 C)] 97.4 F (36.3 C) (09/10 0500) Pulse Rate:  [72-97] 72 (09/10 0500) Resp:  [18] 18 (09/10 0500) BP: (105-136)/(55-87) 136/87 (09/10 0500) SpO2:  [95 %-98 %] 98 % (09/09 2354)  Physical Exam:  General: alert, cooperative and no distress Lochia: appropriate Uterine Fundus: firm Incision: healing well DVT Evaluation: No evidence of DVT seen on physical exam.   Recent Labs  09/05/16 1010 09/07/16 0524  HGB 10.8* 8.5*  HCT 32.7* 25.7*    Assessment/Plan: Status post Cesarean section. Doing well postoperatively.  Continue current care.  Fredderick Swanger II,Euclide Granito E 09/08/2016, 9:05 AM

## 2016-09-09 NOTE — Lactation Note (Signed)
This note was copied from a baby's chart. Lactation Consultation Note; Mom concerned about how baby is doing, concerned about weight loss. Mom is tired with many questions- wants to do it right. Reports breasts are feeling different and is easily able to hand express Colostrum- reports it is easier today than yesterday. Dr Vonna KotykeClaire in and discussed heart shaped tongue with parents.Suggested watching and see how things go. Mom reports nipples are sore, they are pink but intact. Using coconut oil and has comfort gels- encouraged not to use both at the same time. Baby awake and latched well with minimal assist from me. Reviewed engorgement prevention and treatment. Has Spectra pump for home. OP appointment made for Thursday 9/17 at 9 am. No further questions at present. To call prn  Patient Name: Joyce Greene Reason for consult: Follow-up assessment   Maternal Data Formula Feeding for Exclusion: No Has patient been taught Hand Expression?: Yes Does the patient have breastfeeding experience prior to this delivery?: No  Feeding Feeding Type: Breast Fed Length of feed: 15 min  LATCH Score/Interventions Latch: Grasps breast easily, tongue down, lips flanged, rhythmical sucking.  Audible Swallowing: Spontaneous and intermittent  Type of Nipple: Everted at rest and after stimulation  Comfort (Breast/Nipple): Filling, red/small blisters or bruises, mild/mod discomfort  Problem noted: Mild/Moderate discomfort Interventions (Mild/moderate discomfort): Comfort gels;Hand expression (coconut oil)  Hold (Positioning): Assistance needed to correctly position infant at breast and maintain latch. Intervention(s): Breastfeeding basics reviewed;Position options;Skin to skin  LATCH Score: 8  Lactation Tools Discussed/Used WIC Program: No   Consult Status Consult Status: Complete Date: 09/12/16 Follow-up type: Out-patient    Pamelia HoitWeeks, Corday Wyka D Greene, 10:37  AM

## 2016-09-09 NOTE — Lactation Note (Addendum)
This note was copied from a baby's chart. Lactation Consultation Note New mom worried about baby being hungry and having 10% weight loss. At 61 hrs. Old has had 12 stools, 7 voids. FOB is as worried and upset as mom. Baby crying. RN got mom to pump w/DEBP. Mom pumped 7ml colostrum. Gave to baby w/spoon. Noted heart shaped tongue. Thick upper labial frenulum, and visual lower frenulum. Baby's tongue slightly dry. Assessed suck w/gloved finger. Wasn't interested in sucking on finger. Put baby to breast in football hold. Taught positioning. Baby latched well. Taught chin tug if needed. Mom has pendulum breast w/nipple at the bottom of breast slightly inwards. Elevated breast w/cloth for lifting support.  Hand expressed transitional colostrum. Latched baby w/o difficulty. Heard swallows. Baby appeared satisfied after BF. Jaundice.   LC feels that baby is getting colostrum d/t output which has caused large weight loss. D/t large output means baby is getting colostrum from mom. LC feels d/t heart shaped tongue baby may not be getting enough of transfer.   Encouraged mom to post-pump and supplement w/colostrum. Discussed milk coming in, I&O, cluster feeding, supply and demand.  Parents excited and relieved to see colostrum. Reported to RN of consult as well as CN RN to report to MD.  Patient Name: Joyce Greene   Today's Date: 09/09/2016 Reason for consult: Follow-up assessment;Infant weight loss   Maternal Data    Feeding Feeding Type: Breast Fed Length of feed: 25 min  LATCH Score/Interventions Latch: Grasps breast easily, tongue down, lips flanged, rhythmical sucking. Intervention(s): Adjust position;Assist with latch;Breast massage;Breast compression  Audible Swallowing: Spontaneous and intermittent Intervention(s): Skin to skin;Hand expression;Alternate breast massage  Type of Nipple: Everted at rest and after stimulation  Comfort (Breast/Nipple): Filling, red/small blisters or  bruises, mild/mod discomfort  Problem noted: Mild/Moderate discomfort Interventions (Mild/moderate discomfort): Post-pump;Hand massage;Hand expression  Hold (Positioning): Assistance needed to correctly position infant at breast and maintain latch. Intervention(s): Skin to skin;Position options;Support Pillows;Breastfeeding basics reviewed  LATCH Score: 8  Lactation Tools Discussed/Used Tools: Pump Breast pump type: Double-Electric Breast Pump   Consult Status Consult Status: Follow-up Date: 09/09/16 Follow-up type: In-patient    [name], Diamond NickelLAURA G 09/09/2016, 2:14 AM

## 2016-09-09 NOTE — Discharge Summary (Signed)
Obstetric Discharge Summary Reason for Admission: cesarean section Prenatal Procedures: ultrasound Intrapartum Procedures: cesarean: low cervical, transverse Postpartum Procedures: none Complications-Operative and Postpartum: none Hemoglobin  Date Value Ref Range Status  09/07/2016 8.5 (L) 12.0 - 15.0 g/dL Final    Comment:    REPEATED TO VERIFY DELTA CHECK NOTED    HCT  Date Value Ref Range Status  09/07/2016 25.7 (L) 36.0 - 46.0 % Final    Physical Exam:  General: alert and cooperative Lochia: appropriate Uterine Fundus: firm Incision: healing well, honeycomb dressing and steri strips replaced DVT Evaluation: No evidence of DVT seen on physical exam. Negative Homan's sign. No cords or calf tenderness. No significant calf/ankle edema. Calf/Ankle edema is present.  Discharge Diagnoses: Term Pregnancy-delivered  Discharge Information: Date: 09/09/2016 Activity: pelvic rest Diet: routine Medications: PNV and Ibuprofen Condition: stable Instructions: refer to practice specific booklet Discharge to: home   Newborn Data: Live born female  Birth Weight: 8 lb 11.7 oz (3960 g) APGAR: 9, 8  Home with mother.  Micha Dosanjh G 09/09/2016, 9:01 AM

## 2016-09-10 ENCOUNTER — Encounter (HOSPITAL_COMMUNITY): Payer: Self-pay | Admitting: Obstetrics and Gynecology

## 2016-09-11 ENCOUNTER — Telehealth (HOSPITAL_COMMUNITY): Payer: Self-pay | Admitting: Lactation Services

## 2016-09-11 ENCOUNTER — Ambulatory Visit (HOSPITAL_COMMUNITY)
Admission: RE | Admit: 2016-09-11 | Discharge: 2016-09-11 | Disposition: A | Discharge status: Home / Self Care | Payer: Managed Care, Other (non HMO) | Source: Ambulatory Visit | Attending: Obstetrics and Gynecology | Admitting: Obstetrics and Gynecology

## 2016-09-11 NOTE — Lactation Note (Signed)
Lactation Consult  Mother's reason for visit:  10% weight loss on discharge Visit Type:  Outpatient Appointment Notes:  See below Consult:  Follow-Up Lactation Consultant:  Judee Clara  Mom in today for follow-up after discharge from hospital.  Baby had a 10% weight loss on day 3.  Today baby is 27 days old, and is still at 10% weight loss.  Pediatrician visit yesterday (7 lbs 11 oz), has baby gaining 3 oz today (7 lbs 14.4 oz).  Baby looks jaundiced to mid chest.  Mom's breasts are soft, rather large, pendulous, and slightly wide spaced.  Mom reports breasts feeling fuller since yesterday. Mom using cross cradle hold, only needing slight adjustment in breast support from [facility].  Baby opens widely and latches deeply, but he repeatedly came off the breast crying.  Mom's nipples looking pinched.  Tried using alternate breast compression to keep baby nutritive, but he continued to come off the breast and fuss.  Switched to football hold, with guidance, baby was able to attain a deeper areolar grasp to breast.  Baby stayed on continuously, hearing some swallowing.  Baby transferred 10 ml after 20 minutes on left breast, and 8 ml after 15 minutes on right in football hold.  Baby needing stimulation during feeding occasionally, but kept a slow pace with his suck swallows. Nipples looked rounded post latch. Transitional milk easily expressed from breasts.   Talked with parents about offering supplement of expressed breast milk after baby breast feeds.  Parents taught how to use the Foley cup, 5 french feeding tube and syringe on finger, dropper, and or slow flow bottle using "pace method" of feeding.  Mom has a Spectra DEBP at home.  Recommended she return for a follow up next week to assess how baby is transferring milk, and to check on her milk supply.  Mom declined this.  Has Pediatrician appointment tomorrow 9/14 and a home health nurse visit next week.  Explained importance of weighing baby on the same  scale, and also doing a pre and post feeding weight to assess milk transfer.  Talked about tongue restrictions causing decrease in milk transfer, nipple soreness/trauma etc.   Recommended Plan- 1-  Breastfeed on cue (8-12 times per 24 hrs)      Deep wide latch      Alternate breast compression during sucking stage      Switch breasts when no swallows noted 2- Offer 30 ml of EBM by cup/spoon/finger feeding/slow flow bottle using pace method of feeding 3- Pump both breasts 15-20 mins 4- Rest 5- Call prn for assistance   _______________________________________________________________________ Baby's Name:  Joyce Greene Date of Birth:  09/06/2018 Pediatrician:  Earlene Plater Gender:  female Gestational Age: [redacted]w[redacted]d (At Birth) Birth Weight:  8 lb 11.7 oz (3960 g) Weight at Discharge:  Weight: 7 lb 14.1 oz (3575 g)             Date of Discharge:  09/09/2016      Filed Weights   09/07/16 0130 09/08/16 0136 09/09/16 0042  Weight: 8 lb 8.7 oz (3875 g) 8 lb 2.3 oz (3695 g) 7 lb 14.1 oz (3575 g)  [facility] weight taken from location [facility]:  7 lbs 11 oz Pediatrician Office     Weight today:  7 lbs 14.4 oz    ________________________________________________________________________  Mother's Name: Joyce Greene Type of delivery:  Cesarean Section Breastfeeding Experience:  none Maternal Medical Conditions:  none Maternal Medications:  PNV, ibuprofen ________________________________________________________________________ Breastfeeding History (  Post Discharge) Frequency of breastfeeding:  1 1/2 - 2 hrs Duration of feeding: 10-30 mins Patient does not supplement or pump. Infant Intake and Output Assessment Voids:  6 in 24 hrs.  Color:  Clear yellow Stools:  6 in 24 hrs.  Color:  Green _______________________________________________________________________ Maternal Breast Assessment Breast:  Soft Nipple:  Erect short nipple shaft Pain level:  0 with a good latch Pain  interventions:  coconut oil ______________________________________________________________________ Feeding Assessment/Evaluation  Initial feeding assessment: Infant's oral assessment:  Variance, short posterior frenulum noted Positioning:  Cross cradle Left breast LATCH documentation:  Latch:  1 = Repeated attempts needed to sustain latch, nipple held in mouth throughout feeding, stimulation needed to elicit sucking reflex.  Audible swallowing:  1 = A few with stimulation  Type of nipple:  2 = Everted at rest and after stimulation  Comfort (Breast/Nipple):  1 = Filling, red/small blisters or bruises, mild/mod discomfort  Hold (Positioning):  1 = Assistance needed to correctly position infant at breast and maintain latch  LATCH score:  6 Attached assessment:  Deep  Lips flanged:  Yes.    Lips untucked:  Yes.   Suck assessment:  Displays both Pre-feed weight:  3584 g   Post-feed weight:  3594 g  Amount transferred:  10 ml Additional Feeding Assessment -  IPositioning:  Football Left breast LATCH documentation:  Latch:  2 = Grasps breast easily, tongue down, lips flanged, rhythmical sucking.  Audible swallowing:  2 = Spontaneous and intermittent  Type of nipple:  2 = Everted at rest and after stimulation  Comfort (Breast/Nipple):  1 = Filling, red/small blisters or bruises, mild/mod discomfort  Hold (Positioning):  2 = No assistance needed to correctly position infant at breast  LATCH score:  9 Attached assessment:  Deep  Lips flanged:  Yes.    Lips untucked:  Yes.   Suck assessment:  Displays both Pre-feed weight: 3594 g  Post-feed weight:  3602 g  Amount transferred:  8 ml Total amount transferred:  18 ml Total supplement given:  0 ml

## 2016-09-11 NOTE — Telephone Encounter (Signed)
Mr. Joyce Greene called on behalf of his wife to see if it was ok if she take [name] I explained that the loratadine component is safe (L1), but the pseudoephedrine component could impact milk supply. Mr. Joyce Greene plans to go and buy regular Claritin. I also explained that if Joyce Greene needs a decongestant, then phenylephrine is a safe alternative. Parents LC appt for tomorrow morning was rescheduled for today at 2:30pm. [name], RN, IBCLC

## 2016-09-12 ENCOUNTER — Ambulatory Visit (HOSPITAL_COMMUNITY): Admit: 2016-09-12 | Payer: Managed Care, Other (non HMO)

## 2016-09-30 ENCOUNTER — Encounter (HOSPITAL_COMMUNITY): Payer: Self-pay | Admitting: *Deleted

## 2017-09-24 ENCOUNTER — Other Ambulatory Visit (HOSPITAL_COMMUNITY): Payer: Self-pay | Admitting: Obstetrics and Gynecology

## 2017-09-24 DIAGNOSIS — Z3689 Encounter for other specified antenatal screening: Secondary | ICD-10-CM

## 2017-10-02 ENCOUNTER — Encounter (HOSPITAL_COMMUNITY): Payer: Managed Care, Other (non HMO)

## 2017-10-02 ENCOUNTER — Ambulatory Visit (HOSPITAL_COMMUNITY): Payer: Managed Care, Other (non HMO)

## 2017-10-07 ENCOUNTER — Encounter (HOSPITAL_COMMUNITY): Payer: Self-pay | Admitting: *Deleted

## 2017-10-09 ENCOUNTER — Encounter (HOSPITAL_COMMUNITY): Payer: Self-pay

## 2017-10-09 ENCOUNTER — Ambulatory Visit (HOSPITAL_COMMUNITY)
Admission: RE | Admit: 2017-10-09 | Discharge: 2017-10-09 | Disposition: A | Discharge status: Home / Self Care | Payer: Self-pay | Source: Ambulatory Visit | Attending: Obstetrics and Gynecology | Admitting: Obstetrics and Gynecology

## 2017-10-23 ENCOUNTER — Ambulatory Visit (HOSPITAL_COMMUNITY)
Admission: RE | Admit: 2017-10-23 | Discharge: 2017-10-23 | Disposition: A | Discharge status: Home / Self Care | Payer: BLUE CROSS/BLUE SHIELD | Source: Ambulatory Visit | Attending: Obstetrics and Gynecology | Admitting: Obstetrics and Gynecology

## 2017-10-23 ENCOUNTER — Other Ambulatory Visit (HOSPITAL_COMMUNITY): Payer: Self-pay | Admitting: *Deleted

## 2017-10-23 ENCOUNTER — Other Ambulatory Visit (HOSPITAL_COMMUNITY): Payer: Self-pay | Admitting: Obstetrics and Gynecology

## 2017-10-23 ENCOUNTER — Encounter (HOSPITAL_COMMUNITY): Payer: Self-pay

## 2017-10-23 DIAGNOSIS — Z3689 Encounter for other specified antenatal screening: Secondary | ICD-10-CM | POA: Diagnosis present

## 2017-10-23 DIAGNOSIS — O09522 Supervision of elderly multigravida, second trimester: Secondary | ICD-10-CM

## 2017-10-23 DIAGNOSIS — Z148 Genetic carrier of other disease: Secondary | ICD-10-CM | POA: Diagnosis not present

## 2017-10-23 DIAGNOSIS — Z3A26 26 weeks gestation of pregnancy: Secondary | ICD-10-CM

## 2017-10-23 DIAGNOSIS — O358XX0 Maternal care for other (suspected) fetal abnormality and damage, not applicable or unspecified: Secondary | ICD-10-CM | POA: Diagnosis not present

## 2017-10-23 DIAGNOSIS — O09529 Supervision of elderly multigravida, unspecified trimester: Secondary | ICD-10-CM | POA: Insufficient documentation

## 2017-10-23 DIAGNOSIS — O09899 Supervision of other high risk pregnancies, unspecified trimester: Secondary | ICD-10-CM | POA: Insufficient documentation

## 2017-10-23 DIAGNOSIS — O321XX0 Maternal care for breech presentation, not applicable or unspecified: Secondary | ICD-10-CM | POA: Insufficient documentation

## 2017-10-23 NOTE — Consult Note (Signed)
Maternal Fetal Medicine Consultation  Requesting Provider(s): Elon Spanner  Primary OB: Elon Spanner Reason for consultation: 1. AMA 2. 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 Single umbilical artery  HPI: 36yo P1031 at 26+2 weeks, referred for finding of single umbilical artery (SUA) on office [place]. The patient will be 36 at the time of delivery which gives her a 1 in 102 risk for aneuploidy at term. 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 She and her husband have been offered testing for both age-related aneuploidies and carrier testing for the LCHAD and ACAD9 and have declined. They have been referred for discussion of the SUA. 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 We were unable to get adequate images of the RVOT during the cardiac evaluation. All other findings were normal.  OB History: OB History    Gravida Para Term Preterm AB Living   5 1 1   3 1    SAB TAB Ectopic Multiple Live Births   3     0 1      PMH:  Past Medical History:  Diagnosis Date  . Medical history non-contributory     PSH:  Past Surgical History:  Procedure Laterality Date  . CESAREAN SECTION N/A 09/06/2016   Procedure: CESAREAN SECTION;  Surgeon: Harold Hedge, MD;  Location: [facility] BIRTHING SUITES;  Service: Obstetrics;  Laterality: N/A;  Primary edc 09/07/16 nkda   . NO PAST SURGERIES     Meds: PNV Allergies: NKDA FH: See EPIC section Soc: See EPIC section  Review of Systems: no vaginal bleeding or cramping/contractions, no LOF, no nausea/vomiting. All other systems reviewed and are negative.  PE:  VS: See EPIC section GEN: well-appearing female ABD: gravid, NT  Please see separate document for fetal ultrasound report.  A/P: 1. AMA: the patient and her husband have previously declined NIPT. See below for further discussion 2. 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 Their previous baby is 14 months and shows no signs of metabolic disease. 3. 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 We cannot be 100% certain of this due to our suboptimal views of the RVOT. The rest of the cardiac evaluation and the rest of the anatomy is normal, so the probability of an isolated RVOT abnormality that would not show up affecting other portions of the heart is very low. Isolated SUA has an association with aneuploidy of c. 0.5-0.7%. Her age related risk is actually higher. We discussed this at length and she and her husband are not interested in pursuing NIPT at this time. Invasive testing is not indicated for isolated SUA. The risk for IUGR in isolated SUA is controversial, with some studies showing a nonstatistically significant trend toward IUGR and some showing rates c. 11%. Growth on today's scan is robust. A repeat scan in your office at 34 weeks to assess growth is sufficient assessment. Given the incomplete nature of our evaluation of the heart I offered them a repeat scan here in 3 weeks to assess the RVOT or a referral to pediatric cardiology for a fetal echocardiogram. The elected to return here for a repeat evaluation. If no IUGR is present on your scan, no antenatal testing is indicated  Thank you for the opportunity to be a part of the care of [place].  Please contact our office if we can be of further assistance.   I spent approximately 30 minutes with this patient with over 50% of time spent in face-to-face counseling.

## 2017-10-24 ENCOUNTER — Encounter (HOSPITAL_COMMUNITY): Payer: Self-pay

## 2017-10-24 ENCOUNTER — Other Ambulatory Visit (HOSPITAL_COMMUNITY): Payer: Self-pay

## 2017-11-13 ENCOUNTER — Ambulatory Visit (HOSPITAL_COMMUNITY): Admission: RE | Admit: 2017-11-13 | Payer: BLUE CROSS/BLUE SHIELD | Source: Ambulatory Visit

## 2018-02-10 IMAGING — US US MFM OB DETAIL+14 WK
1 series · 14 of 28 positions shown · non-contrast
Comparison: none

[Series 1: us mfm ob detail+14 wk · 14 of 62 slices shown]
[im 3/62]
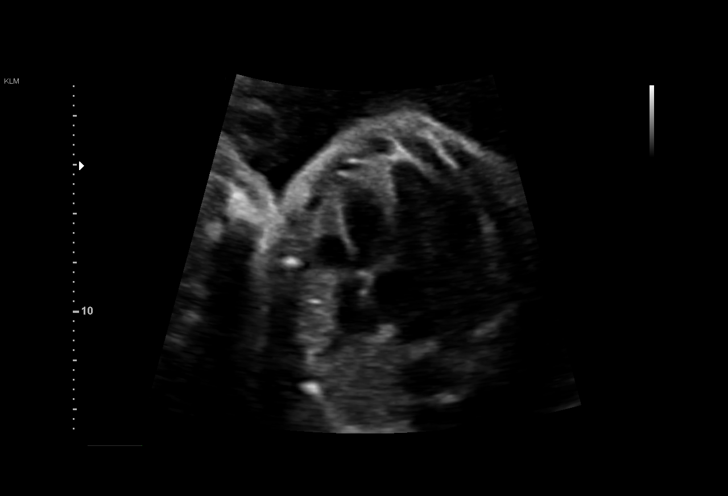
[im 7/62]
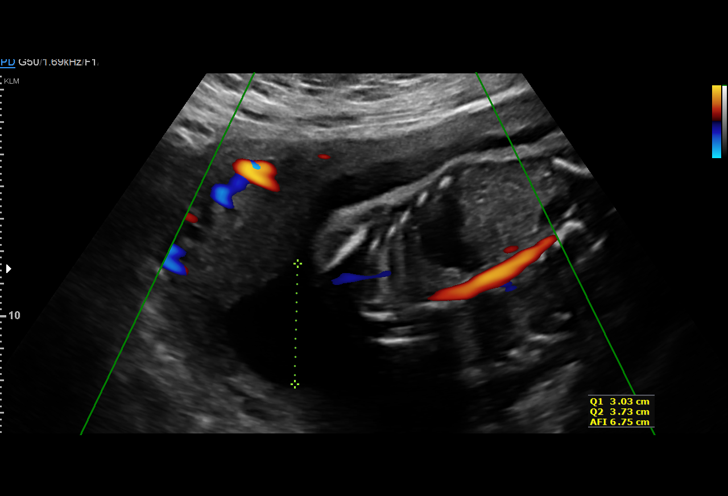
[im 12/62]
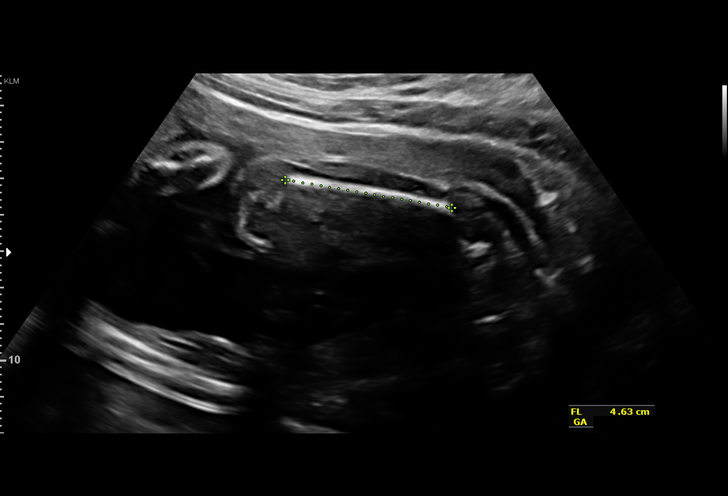
[im 16/62]
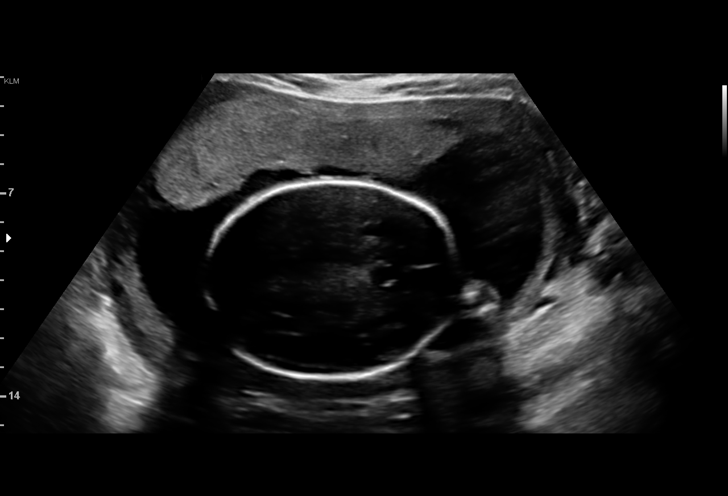
[im 21/62]
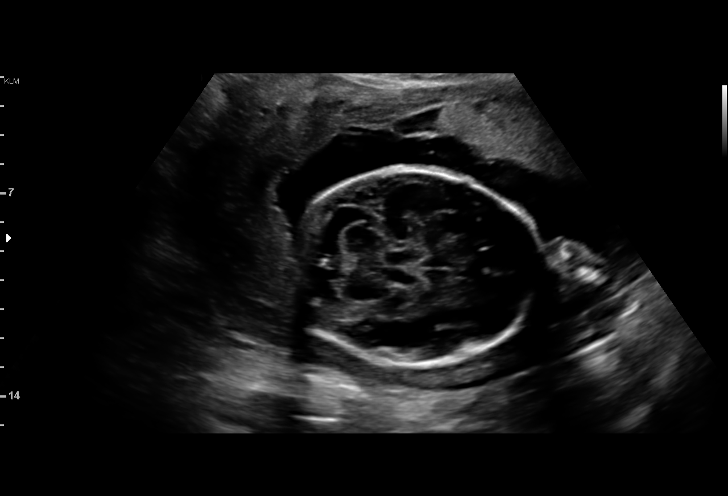
[im 25/62]
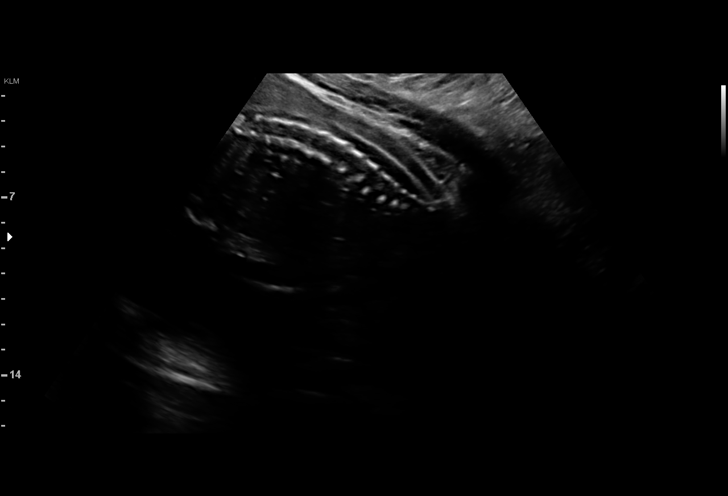
[im 30/62]
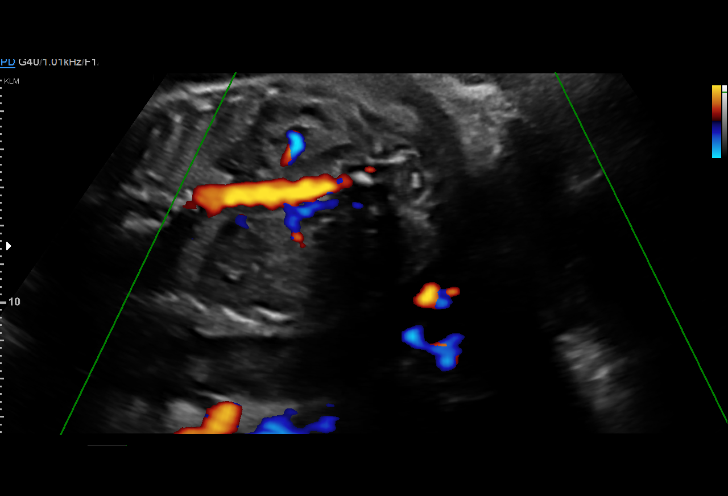
[im 34/62]
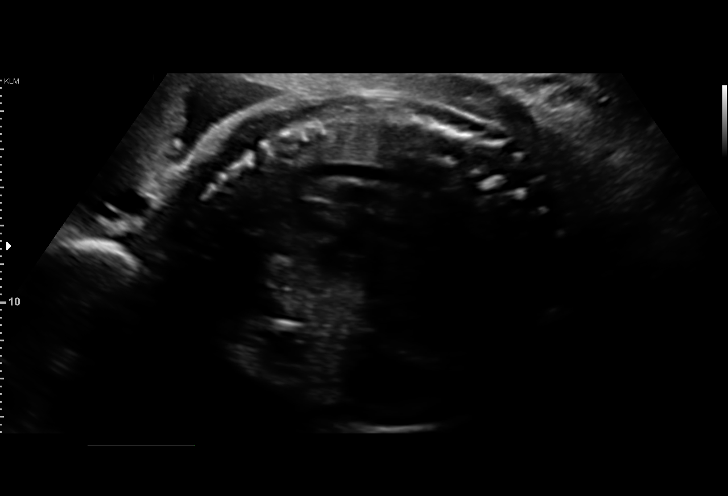
[im 39/62]
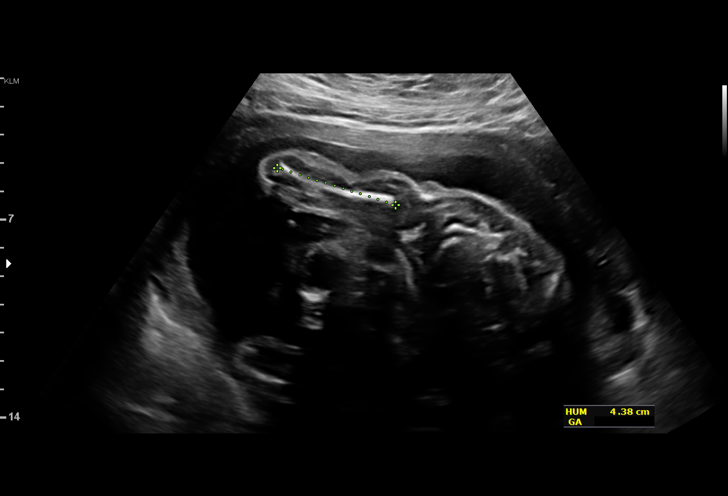
[im 43/62]
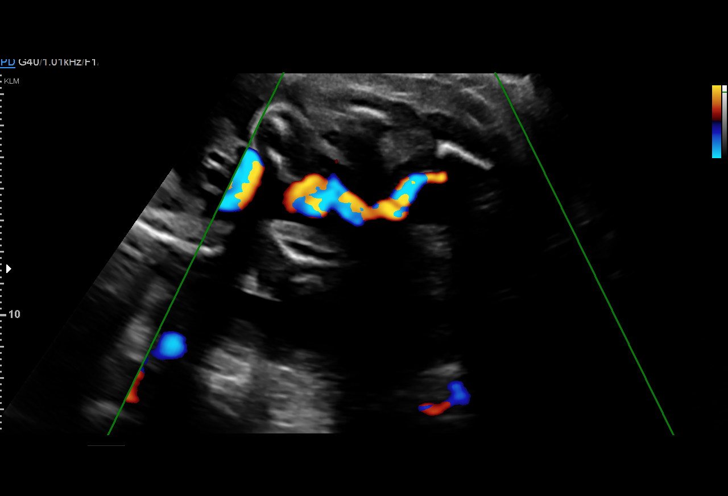
[im 48/62]
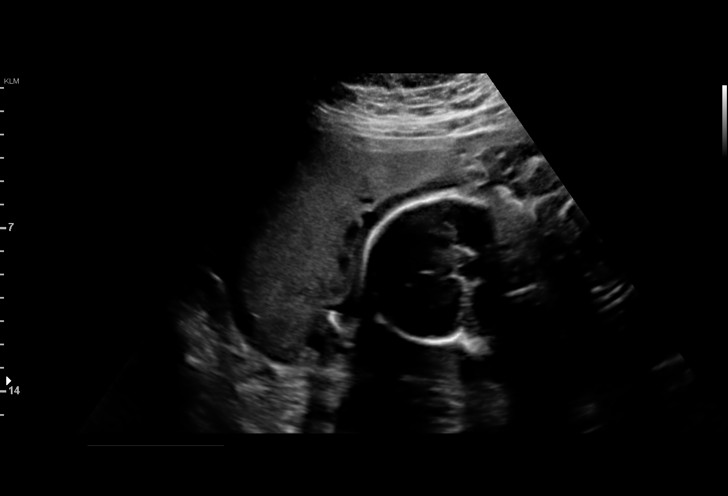
[im 52/62]
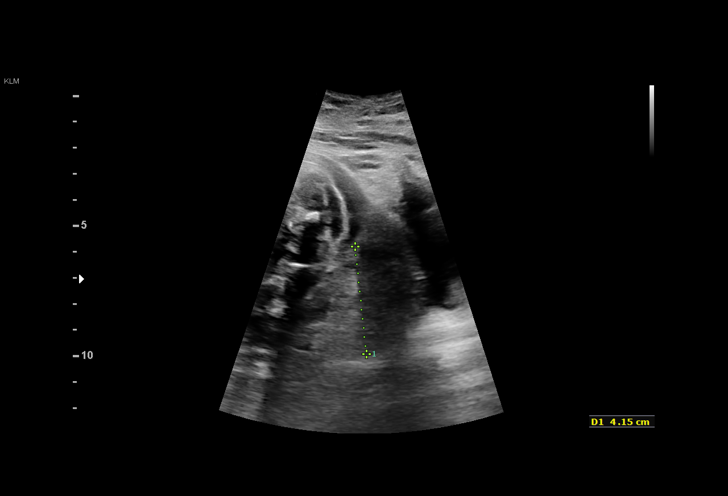
[im 57/62]
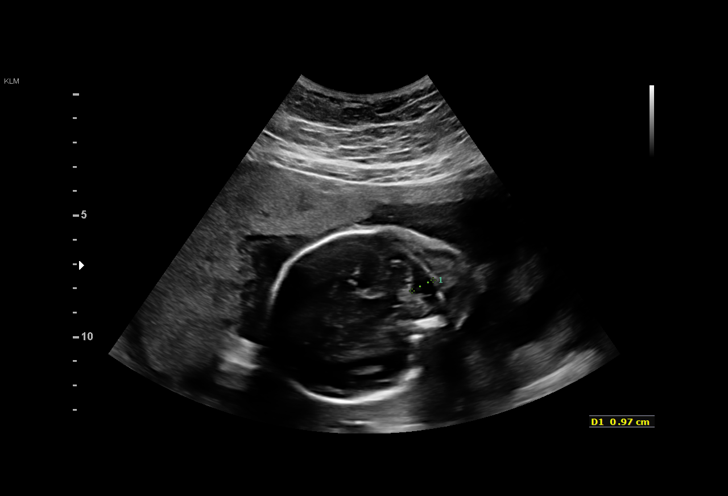
[im 62/62]
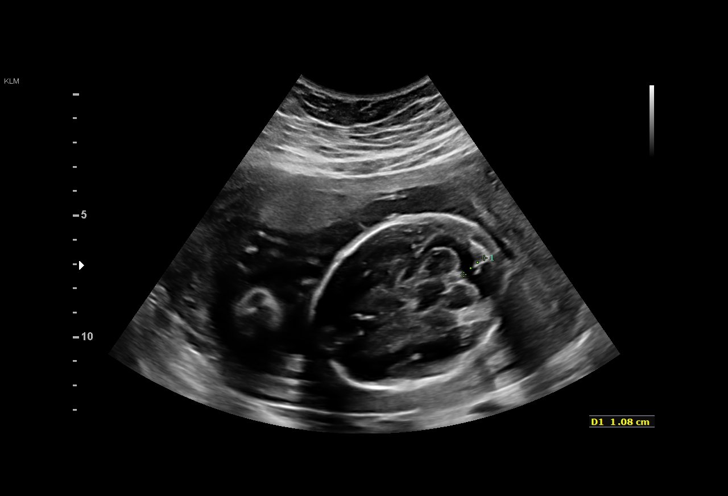

[14 of 28 positions shown; findings below may reference images not displayed]

1  MENGPING JENGGO              [id number]      [id number]     [id number]
Indications

26 weeks gestation of pregnancy
Advanced maternal age multigravida 35+,
second trimester
2 vessel umbilical cord
Previous cesarean delivery, antepartum
Genetic carrier (3 genetic disorders)
Encounter for fetal anatomic survey
OB History

Gravidity:    5         Term:   1        Prem:   0        SAB:   3
TOP:          0       Ectopic:  0        Living: 1
Fetal Evaluation

Num Of Fetuses:     1
Fetal Heart         153
Rate(bpm):
Cardiac Activity:   Observed
Presentation:       Breech
Placenta:           Fundal, above cervical os
P. Cord Insertion:  Visualized

Amniotic Fluid
AFI FV:      Subjectively within normal limits

AFI Sum(cm)     %Tile       Largest Pocket(cm)
13.61           41

RUQ(cm)       RLQ(cm)       LUQ(cm)        LLQ(cm)
3.03
Biometry

BPD:      67.2  mm     G. Age:  27w 1d         67  %    CI:        72.46   %    70 - 86
FL/HC:      18.4   %    18.6 -
HC:      251.1  mm     G. Age:  27w 2d         58  %    HC/AC:      1.09        1.04 -
AC:      230.4  mm     G. Age:  27w 3d         75  %    FL/BPD:     68.9   %    71 - 87
FL:       46.3  mm     G. Age:  25w 3d         14  %    FL/AC:      20.1   %    20 - 24
HUM:      44.1  mm     G. Age:  26w 1d         45  %
CER:      31.6  mm     G. Age:  27w 4d         75  %

Est. FW:     967  gm      2 lb 2 oz     61  %
Gestational Age

U/S Today:     26w 6d                                        EDD:   01/23/18
Best:          26w 2d     Det. By:  Early Ultrasound         EDD:   01/27/18
(08/27/17)
Anatomy

Cranium:               Appears normal         Aortic Arch:            Appears normal
Cavum:                 Appears normal         Ductal Arch:            Not well visualized
Ventricles:            Appears normal         Diaphragm:              Appears normal
Choroid Plexus:        Appears normal         Stomach:                Appears normal, left
sided
Cerebellum:            Appears normal         Abdomen:                Appears normal
Posterior Fossa:       Appears normal         Abdominal Wall:         Not well visualized
Nuchal Fold:           Not applicable (>20    Cord Vessels:           2 Vessel Cord
wks GA)
Face:                  Orbits nl; profile not Kidneys:                Appear normal
well visualized
Lips:                  Not well visualized    Bladder:                Appears normal
Thoracic:              Appears normal         Spine:                  Appears normal
Heart:                 Appears normal         Upper Extremities:      Visualized
(4CH, axis, and situs
RVOT:                  Not well visualized    Lower Extremities:      Visualized
LVOT:                  Appears normal

Other:  Fetus appears to be a female.
Cervix Uterus Adnexa

Cervix
Length:            3.2  cm.
Normal appearance by transabdominal scan.
Impression

Singleton intrauterine pregnancy at 26+2 weeks with single
umbilical artery
Review of the anatomy shows the presence of a single
umbilical artery. No other sonographic markers for aneuploidy
or structural anomalies are seen
However, our views of the right ventricular outflow tract
should be considered suboptimal secondary to fetal position
and maternal habitus
Amniotic fluid volume is normal
Estimated fetal weight is 967g which is growth in the 61st
percentile
Recommendations

See MFM consult

## 2018-04-21 ENCOUNTER — Encounter (HOSPITAL_COMMUNITY): Payer: Self-pay
